# Patient Record
Sex: Male | Born: 2000 | Race: Black or African American | Hispanic: No | Marital: Single | Smoking: Never smoker
Health system: Southern US, Community
[De-identification: ages and names within clinical notes are randomized; demographics above are authoritative.]

## PROBLEM LIST (undated history)

## (undated) DIAGNOSIS — N4400 Torsion of testis, unspecified: Secondary | ICD-10-CM

---
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
## 2020-05-09 ENCOUNTER — Emergency Department (HOSPITAL_COMMUNITY): Payer: Medicaid Other

## 2020-05-09 ENCOUNTER — Observation Stay (HOSPITAL_COMMUNITY)
Admission: EM | Admit: 2020-05-09 | Discharge: 2020-05-10 | Disposition: A | Discharge status: Home / Self Care | Payer: Medicaid Other | Attending: Emergency Medicine | Admitting: Emergency Medicine

## 2020-05-09 ENCOUNTER — Encounter (HOSPITAL_COMMUNITY): Payer: Self-pay

## 2020-05-09 ENCOUNTER — Other Ambulatory Visit: Payer: Self-pay

## 2020-05-09 DIAGNOSIS — N4400 Torsion of testis, unspecified: Principal | ICD-10-CM

## 2020-05-09 DIAGNOSIS — Z20822 Contact with and (suspected) exposure to covid-19: Secondary | ICD-10-CM | POA: Diagnosis not present

## 2020-05-09 LAB — CBC WITH DIFFERENTIAL/PLATELET
Abs Immature Granulocytes: 0.04 10*3/uL (ref 0.00–0.07)
Basophils Absolute: 0 10*3/uL (ref 0.0–0.1)
Basophils Relative: 0 %
Eosinophils Absolute: 0 10*3/uL (ref 0.0–0.5)
Eosinophils Relative: 0 %
HCT: 44.6 % (ref 39.0–52.0)
Hemoglobin: 14.7 g/dL (ref 13.0–17.0)
Immature Granulocytes: 0 %
Lymphocytes Relative: 20 %
Lymphs Abs: 2.8 10*3/uL (ref 0.7–4.0)
MCH: 29.5 pg (ref 26.0–34.0)
MCHC: 33 g/dL (ref 30.0–36.0)
MCV: 89.6 fL (ref 80.0–100.0)
Monocytes Absolute: 0.9 10*3/uL (ref 0.1–1.0)
Monocytes Relative: 6 %
Neutro Abs: 9.8 10*3/uL — ABNORMAL HIGH (ref 1.7–7.7)
Neutrophils Relative %: 74 %
Platelets: 244 10*3/uL (ref 150–400)
RBC: 4.98 MIL/uL (ref 4.22–5.81)
RDW: 12.5 % (ref 11.5–15.5)
WBC: 13.5 10*3/uL — ABNORMAL HIGH (ref 4.0–10.5)
nRBC: 0 % (ref 0.0–0.2)

## 2020-05-09 LAB — BASIC METABOLIC PANEL
Anion gap: 13 (ref 5–15)
BUN: 9 mg/dL (ref 6–20)
CO2: 23 mmol/L (ref 22–32)
Calcium: 9.9 mg/dL (ref 8.9–10.3)
Chloride: 103 mmol/L (ref 98–111)
Creatinine, Ser: 1.03 mg/dL (ref 0.61–1.24)
GFR calc Af Amer: >60 mL/min (ref >60)
GFR calc non Af Amer: >60 mL/min (ref >60)
Glucose, Bld: 140 mg/dL — ABNORMAL HIGH (ref 70–99)
Potassium: 3.9 mmol/L (ref 3.5–5.1)
Sodium: 139 mmol/L (ref 135–145)

## 2020-05-09 MED ORDER — HYDROMORPHONE HCL 1 MG/ML IJ SOLN
1.0000 mg | Freq: Once | INTRAMUSCULAR | Status: AC
  Administered 2020-05-09: 1 mg via INTRAVENOUS
  Filled 2020-05-09: qty 1

## 2020-05-09 MED ORDER — CEFAZOLIN (ANCEF) 1 G IV SOLR: 1.0000 g | INTRAVENOUS | Status: DC

## 2020-05-09 MED ORDER — CEFAZOLIN SODIUM-DEXTROSE 2-4 GM/100ML-% IV SOLN
2.0000 g | INTRAVENOUS | Status: AC
  Administered 2020-05-10: 2 g via INTRAVENOUS
  Filled 2020-05-09: qty 100

## 2020-05-09 MED ORDER — ONDANSETRON HCL 4 MG/2ML IJ SOLN
4.0000 mg | Freq: Once | INTRAMUSCULAR | Status: AC
  Administered 2020-05-09: 4 mg via INTRAVENOUS
  Filled 2020-05-09: qty 2

## 2020-05-09 NOTE — ED Triage Notes (Signed)
Pt comes in [facility] for left lower abdominal pain that goes into the groin. Denies urinary or bowel symptoms. Last sexual inter course possible yesterday. Sudden pain after 8pm

## 2020-05-09 NOTE — ED Provider Notes (Signed)
[facility] [place] EMERGENCY DEPARTMENT Provider Note   CSN: [id number] Arrival date & time: 05/09/20  2123     History Chief Complaint  Patient presents with  . Groin Pain    Adam Wilkinson is a 19 y.o. male.  HPI Patient developed acute left groin/testicle pain at 8 PM tonight.  States severe.  States he has nausea and has vomited.  Pain may not be bulging.  Has not had symptoms like this before.  No dysuria.  No penile discharge.  No fevers.  No trauma.    History reviewed. No pertinent past medical history.  There are no problems to display for this patient.   History reviewed. No pertinent surgical history.     History reviewed. No pertinent family history.  Social History   Tobacco Use  . Smoking status: Never Smoker  . Smokeless tobacco: Never Used  Substance Use Topics  . Alcohol use: No  . Drug use: Yes    Types: Marijuana    Home Medications Prior to Admission medications   Medication Sig Start Date End Date Taking? Authorizing Provider  docusate sodium (COLACE) 100 MG capsule Take 100 mg by mouth 2 (two) times daily.    [provider]  ibuprofen (ADVIL,MOTRIN) 600 MG tablet Take 1 tablet (600 mg total) by mouth every 6 (six) hours as needed. 01/29/17   Burgess Amor, PA-C  polyethylene glycol powder (GLYCOLAX/MIRALAX) powder Take 1 Container by mouth daily. 01/01/14   Ward, Layla Maw, DO    Allergies    Cinnamon  Review of Systems   Review of Systems  Constitutional: Negative for appetite change.  Respiratory: Negative for shortness of breath.   Cardiovascular: Negative for chest pain.  Gastrointestinal: Positive for abdominal pain, nausea and vomiting.  Genitourinary: Positive for testicular pain.  Musculoskeletal: Negative for back pain.  Skin: Negative for rash.  Neurological: Negative for weakness.  Psychiatric/Behavioral: Negative for confusion.    Physical Exam Updated Vital Signs BP 124/66   Pulse (!) 54   Temp 98 F (36.7 C)    Resp 18   Ht 5\' 10"  (1.778 m)   Wt 77.1 kg   SpO2 97%   BMI 24.39 kg/m   Physical Exam Vitals reviewed.  Constitutional:      Comments: Patient appears uncomfortable  HENT:     Head: Normocephalic.  Cardiovascular:     Rate and Rhythm: Normal rate.  Pulmonary:     Breath sounds: No wheezing or rhonchi.  Abdominal:     Tenderness: There is abdominal tenderness.     Comments: Left lower quadrant tenderness without frank hernia palpated.  Genitourinary:    Comments: Left testicular tenderness. Skin:    General: Skin is warm.     Capillary Refill: Capillary refill takes less than 2 seconds.  Neurological:     Mental Status: He is alert and oriented to person, place, and time.     ED Results / Procedures / Treatments   Labs (all labs ordered are listed, but only abnormal results are displayed) Labs Reviewed  BASIC METABOLIC PANEL - Abnormal; Notable for the following components:      Result Value   Glucose, Bld 140 (*)    All other components within normal limits  CBC WITH DIFFERENTIAL/PLATELET - Abnormal; Notable for the following components:   WBC 13.5 (*)    Neutro Abs 9.8 (*)    All other components within normal limits  SARS CORONAVIRUS 2 BY RT PCR (HOSPITAL ORDER, PERFORMED IN [facility])  URINALYSIS, ROUTINE W REFLEX MICROSCOPIC    EKG None  Radiology No results found.  Procedures Procedures (including critical care time)  Medications Ordered in ED Medications  ondansetron (ZOFRAN) injection 4 mg (4 mg Intravenous Given 05/09/20 2148)  HYDROmorphone (DILAUDID) injection 1 mg (1 mg Intravenous Given 05/09/20 2149)  HYDROmorphone (DILAUDID) injection 1 mg (1 mg Intravenous Given 05/09/20 2258)    ED Course  I have reviewed the triage vital signs and the nursing notes.  Pertinent labs & imaging results that were available during my care of the patient were reviewed by me and considered in my medical decision making (see chart for details).      MDM Rules/Calculators/A&P                         Patient seen for acute onset left testicle pain/groin pain.  Initially examined in the hallway since there were no available rooms.  Will attempt to quickly get patient in room and some pain control.  Like will need urgent ultrasound but need better physical exam before I can make this decision.  1005 I was able to examine patient in the room.  Appears to have a high riding testicle on the left with tenderness and decreased mobility of the scrotum.  High likelihood of torsion.  Ultrasound will be called in.  After physical exam I discussed with Dr. Cardell Peach from urology.  The patient's information and actually came to patient's bedside before completion of the ultrasound.  Ultrasound does show torsion.  Will be taken to operating room  CRITICAL CARE Performed by: Benjiman Core Total critical care time: 30 minutes Critical care time was exclusive of separately billable procedures and treating other patients. Critical care was necessary to treat or prevent imminent or life-threatening deterioration. Critical care was time spent personally by me on the following activities: development of treatment plan with patient and/or surrogate as well as nursing, discussions with consultants, evaluation of patient's response to treatment, examination of patient, obtaining history from patient or surrogate, ordering and performing treatments and interventions, ordering and review of laboratory studies, ordering and review of radiographic studies, pulse oximetry and re-evaluation of patient's condition.  Final Clinical Impression(s) / ED Diagnoses Final diagnoses:  Testicular torsion    Rx / DC Orders ED Discharge Orders    None       Benjiman Core, MD 05/09/20 2344

## 2020-05-09 NOTE — H&P (Signed)
H&P  Chief Complaint: Left testicular pain  History of Present Illness:  Adam Wilkinson is a 19 year old male who presents with acute onset of left testicular pain.  He states that the pain began around 8:30 PM.  This is associated with nausea and emesis and left lower quadrant abdominal discomfort and groin discomfort.  He states that his left testicle feels firm.  He has been voiding without difficulty.  He denies dysuria or hematuria.  He denies fevers or chills.  He has never seen a urologist before.  He has no prior urologic history.  He denies a history of urolithiasis, urinary tract infection or trauma to the GU system.  He has no family history of testicular torsion.  He has no significant medical problems.  No prior surgical history.  He denies taking anticoagulation.  History reviewed. No pertinent past medical history.  History reviewed. No pertinent surgical history.  Home Medications:  No current facility-administered medications on file prior to encounter.   Current Outpatient Medications on File Prior to Encounter  Medication Sig Dispense Refill  . docusate sodium (COLACE) 100 MG capsule Take 100 mg by mouth 2 (two) times daily.    Marland Kitchen ibuprofen (ADVIL,MOTRIN) 600 MG tablet Take 1 tablet (600 mg total) by mouth every 6 (six) hours as needed. 30 tablet 0  . polyethylene glycol powder (GLYCOLAX/MIRALAX) powder Take 1 Container by mouth daily. 255 g 0    Allergies:  Allergies  Allergen Reactions  . Cinnamon Swelling    History reviewed. No pertinent family history.  Social History:  reports that he has never smoked. He has never used smokeless tobacco. He reports current drug use. Drug: Marijuana. He reports that he does not drink alcohol.  ROS: A complete review of systems was performed.  All systems are negative except for pertinent findings as noted.  Physical Exam:  Vital signs in last 24 hours: Temp:  [98 F (36.7 C)] 98 F (36.7 C) (09/09 2128) Pulse Rate:  [66]  66 (09/09 2128) Resp:  [22] 22 (09/09 2128) BP: (124-131)/(66-79) 124/66 (09/09 2300) SpO2:  [100 %] 100 % (09/09 2128) Weight:  [77.1 kg] 77.1 kg (09/09 2128) Constitutional:  Alert and oriented, No acute distress Cardiovascular: Regular rate and rhythm Respiratory: Normal respiratory effort, Lungs clear bilaterally GI: Abdomen is soft, nontender, nondistended, no abdominal masses Genitourinary: No CVAT. Normal male phallus, right testicle is in its normal lie and is nontender.  Left testicle is high riding with a horizontal lie and is firm and quite tender.  Absent cremasteric reflex on the left. Neurologic: Grossly intact, no focal deficits Psychiatric: Normal mood and affect  Laboratory Data:  Recent Labs    05/09/20 2144  WBC 13.5*  HGB 14.7  HCT 44.6  PLT 244    Recent Labs    05/09/20 2144  NA 139  K 3.9  CL 103  GLUCOSE 140*  BUN 9  CALCIUM 9.9  CREATININE 1.03     Results for orders placed or performed during the hospital encounter of 05/09/20 (from the past 24 hour(s))  Basic metabolic panel     Status: Abnormal   Collection Time: 05/09/20  9:44 PM  Result Value Ref Range   Sodium 139 135 - 145 mmol/L   Potassium 3.9 3.5 - 5.1 mmol/L   Chloride 103 98 - 111 mmol/L   CO2 23 22 - 32 mmol/L   Glucose, Bld 140 (H) 70 - 99 mg/dL   BUN 9 6 - 20 mg/dL  Creatinine, Ser 1.03 0.61 - 1.24 mg/dL   Calcium 9.9 8.9 - 19.7 mg/dL   GFR calc non Af Amer >60 >60 mL/min   GFR calc Af Amer >60 >60 mL/min   Anion gap 13 5 - 15  CBC with Differential     Status: Abnormal   Collection Time: 05/09/20  9:44 PM  Result Value Ref Range   WBC 13.5 (H) 4.0 - 10.5 K/uL   RBC 4.98 4.22 - 5.81 MIL/uL   Hemoglobin 14.7 13.0 - 17.0 g/dL   HCT 58.8 39 - 52 %   MCV 89.6 80.0 - 100.0 fL   MCH 29.5 26.0 - 34.0 pg   MCHC 33.0 30.0 - 36.0 g/dL   RDW 32.5 49.8 - 26.4 %   Platelets 244 150 - 400 K/uL   nRBC 0.0 0.0 - 0.2 %   Neutrophils Relative % 74 %   Neutro Abs 9.8 (H) 1.7 - 7.7  K/uL   Lymphocytes Relative 20 %   Lymphs Abs 2.8 0.7 - 4.0 K/uL   Monocytes Relative 6 %   Monocytes Absolute 0.9 0 - 1 K/uL   Eosinophils Relative 0 %   Eosinophils Absolute 0.0 0 - 0 K/uL   Basophils Relative 0 %   Basophils Absolute 0.0 0 - 0 K/uL   Immature Granulocytes 0 %   Abs Immature Granulocytes 0.04 0.00 - 0.07 K/uL   No results found for this or any previous visit (from the past 240 hour(s)).  Renal Function: Recent Labs    05/09/20 2144  CREATININE 1.03   Estimated Creatinine Clearance: 118.1 mL/min (by C-G formula based on SCr of 1.03 mg/dL).  Radiologic Imaging: Scrotal ultrasound 05/09/2020: CLINICAL DATA: Testicular pain  EXAM: SCROTAL ULTRASOUND  DOPPLER ULTRASOUND OF THE TESTICLES  TECHNIQUE: Complete ultrasound examination of the testicles, epididymis, and other scrotal structures was performed. Color and spectral Doppler ultrasound were also utilized to evaluate blood flow to the testicles.  COMPARISON: None.  FINDINGS: Right testicle  Measurements: 4.3 x 2.9 x 2.9 cm. No mass or microlithiasis visualized.  Left testicle  Measurements: 4.1 x 2.7 x 3.3 cm. No mass or microlithiasis visualized.  Right epididymis: Normal in size and appearance.  Left epididymis: Normal in size and appearance.  Hydrocele: Mild left hydrocele  Varicocele: None visualized.  Pulsed Doppler interrogation of both testes demonstrates normal arterial and venous blood flow in the right testicle. No blood flow can be documented/visualized on the left.  IMPRESSION: No visible blood flow within the left testicle compatible with testicular torsion.  Critical Value/emergent results were called by telephone at the time of interpretation on 05/09/2020 at 11:41 pm to provider Benjiman Core , who verbally acknowledged these results.   Electronically Signed By: Charlett Nose M.D. On: 05/09/2020 23:44  Impression/Assessment:  1. Left testicular torsion  Plan:   -To OR for left testicular detorsion, bilateral testicular fixation, possible left orchiectomy -I discussed with the patient that we will likely be able to salvage his testicle if we proceed urgently however there is a chance that I may have to remove his left testicle if it is found to be necrotic.  I also discussed the risk of left testicular atrophy even after testicular fixation.  I discussed the risk of postoperative infection or hematoma requiring additional intervention. -Consent obtained -Discussed with the OR team who are on the way to proceed with this urgent case  Jannifer Hick 05/09/2020, 11:38 PM  Matt R. Roderic Lammert MD [facility]  Pager:  [phone]     

## 2020-05-10 ENCOUNTER — Emergency Department (HOSPITAL_COMMUNITY): Payer: Medicaid Other | Admitting: Anesthesiology

## 2020-05-10 ENCOUNTER — Encounter (HOSPITAL_COMMUNITY): Payer: Self-pay | Admitting: Anesthesiology

## 2020-05-10 ENCOUNTER — Encounter (HOSPITAL_COMMUNITY)
Admission: EM | Disposition: A | Discharge status: Home / Self Care | Payer: Self-pay | Source: Home / Self Care | Attending: Emergency Medicine

## 2020-05-10 DIAGNOSIS — N4400 Torsion of testis, unspecified: Secondary | ICD-10-CM

## 2020-05-10 DIAGNOSIS — Z20822 Contact with and (suspected) exposure to covid-19: Secondary | ICD-10-CM | POA: Diagnosis not present

## 2020-05-10 HISTORY — PX: SCROTAL EXPLORATION: SHX2386

## 2020-05-10 LAB — SARS CORONAVIRUS 2 BY RT PCR (HOSPITAL ORDER, PERFORMED IN ~~LOC~~ HOSPITAL LAB): SARS Coronavirus 2: NEGATIVE

## 2020-05-10 SURGERY — EXPLORATION, SCROTUM
Anesthesia: General | Laterality: Left

## 2020-05-10 MED ORDER — MIDAZOLAM HCL 2 MG/2ML IJ SOLN
INTRAMUSCULAR | Status: AC
  Filled 2020-05-10: qty 2

## 2020-05-10 MED ORDER — PROPOFOL 10 MG/ML IV BOLUS
INTRAVENOUS | Status: AC
  Filled 2020-05-10: qty 20

## 2020-05-10 MED ORDER — MIDAZOLAM HCL 2 MG/2ML IJ SOLN
INTRAMUSCULAR | Status: DC | PRN
  Administered 2020-05-10: 2 mg via INTRAVENOUS

## 2020-05-10 MED ORDER — ONDANSETRON HCL 4 MG/2ML IJ SOLN
INTRAMUSCULAR | Status: DC | PRN
  Administered 2020-05-10: 4 mg via INTRAVENOUS

## 2020-05-10 MED ORDER — EPHEDRINE SULFATE 50 MG/ML IJ SOLN
INTRAMUSCULAR | Status: DC | PRN
  Administered 2020-05-10: 10 mg via INTRAVENOUS

## 2020-05-10 MED ORDER — DOCUSATE SODIUM 100 MG PO CAPS: 100.0000 mg | ORAL_CAPSULE | Freq: Every day | ORAL | 2 refills | Status: AC | PRN

## 2020-05-10 MED ORDER — BUPIVACAINE HCL (PF) 0.25 % IJ SOLN
INTRAMUSCULAR | Status: DC | PRN
  Administered 2020-05-10: 5 mL

## 2020-05-10 MED ORDER — HYDROMORPHONE HCL 1 MG/ML IJ SOLN: 0.2500 mg | INTRAMUSCULAR | Status: DC | PRN

## 2020-05-10 MED ORDER — BUPIVACAINE HCL (PF) 0.25 % IJ SOLN
INTRAMUSCULAR | Status: AC
  Filled 2020-05-10: qty 30

## 2020-05-10 MED ORDER — OXYCODONE-ACETAMINOPHEN 5-325 MG PO TABS: 1.0000 | ORAL_TABLET | ORAL | 0 refills | Status: AC | PRN

## 2020-05-10 MED ORDER — FENTANYL CITRATE (PF) 100 MCG/2ML IJ SOLN
INTRAMUSCULAR | Status: AC
  Filled 2020-05-10: qty 2

## 2020-05-10 MED ORDER — OXYCODONE-ACETAMINOPHEN 5-325 MG PO TABS
ORAL_TABLET | ORAL | Status: AC
  Filled 2020-05-10: qty 1

## 2020-05-10 MED ORDER — CHLORHEXIDINE GLUCONATE 0.12 % MT SOLN: 15.0000 mL | Freq: Once | OROMUCOSAL | Status: DC

## 2020-05-10 MED ORDER — ORAL CARE MOUTH RINSE: 15.0000 mL | Freq: Once | OROMUCOSAL | Status: DC

## 2020-05-10 MED ORDER — LACTATED RINGERS IV SOLN: INTRAVENOUS | Status: DC | PRN

## 2020-05-10 MED ORDER — PROPOFOL 10 MG/ML IV BOLUS
INTRAVENOUS | Status: DC | PRN
  Administered 2020-05-10: 50 mg via INTRAVENOUS
  Administered 2020-05-10: 200 mg via INTRAVENOUS

## 2020-05-10 MED ORDER — ONDANSETRON HCL 4 MG/2ML IJ SOLN: 4.0000 mg | Freq: Once | INTRAMUSCULAR | Status: DC | PRN

## 2020-05-10 MED ORDER — FENTANYL CITRATE (PF) 100 MCG/2ML IJ SOLN
INTRAMUSCULAR | Status: DC | PRN
Start: 2020-05-10 — End: 2020-05-10
  Administered 2020-05-10: 100 ug via INTRAVENOUS
  Administered 2020-05-10 (×2): 50 ug via INTRAVENOUS

## 2020-05-10 MED ORDER — 0.9 % SODIUM CHLORIDE (POUR BTL) OPTIME
TOPICAL | Status: DC | PRN
  Administered 2020-05-10: 1000 mL

## 2020-05-10 MED ORDER — LACTATED RINGERS IV SOLN: INTRAVENOUS | Status: DC

## 2020-05-10 SURGICAL SUPPLY — 30 items
BNDG GAUZE ELAST 4 BULKY (GAUZE/BANDAGES/DRESSINGS) ×4 IMPLANT
CLOTH BEACON ORANGE TIMEOUT ST (SAFETY) ×4 IMPLANT
COVER LIGHT HANDLE STERIS (MISCELLANEOUS) ×8 IMPLANT
COVER WAND RF STERILE (DRAPES) ×4 IMPLANT
DECANTER SPIKE VIAL GLASS SM (MISCELLANEOUS) ×4 IMPLANT
DERMABOND ADVANCED (GAUZE/BANDAGES/DRESSINGS) ×2
DERMABOND ADVANCED .7 DNX12 (GAUZE/BANDAGES/DRESSINGS) ×2 IMPLANT
GAUZE SPONGE 4X4 12PLY STRL (GAUZE/BANDAGES/DRESSINGS) ×4 IMPLANT
GLOVE BIOGEL PI IND STRL 7.0 (GLOVE) ×4 IMPLANT
GLOVE BIOGEL PI IND STRL 7.5 (GLOVE) ×2 IMPLANT
GLOVE BIOGEL PI INDICATOR 7.0 (GLOVE) ×4
GLOVE BIOGEL PI INDICATOR 7.5 (GLOVE) ×2
GLOVE ECLIPSE 6.5 STRL STRAW (GLOVE) ×4 IMPLANT
GOWN STRL REUS W/TWL LRG LVL3 (GOWN DISPOSABLE) ×8 IMPLANT
INST SET MINOR GENERAL (KITS) ×4 IMPLANT
KIT TURNOVER KIT A (KITS) ×4 IMPLANT
MANIFOLD NEPTUNE II (INSTRUMENTS) ×4 IMPLANT
NEEDLE HYPO 25X1 1.5 SAFETY (NEEDLE) ×4 IMPLANT
PACK MINOR (CUSTOM PROCEDURE TRAY) ×4 IMPLANT
PENCIL SMOKE EVACUATOR (MISCELLANEOUS) ×4 IMPLANT
SOL PREP POV-IOD 4OZ 10% (MISCELLANEOUS) ×4 IMPLANT
SOL PREP PROV IODINE SCRUB 4OZ (MISCELLANEOUS) ×4 IMPLANT
SUPPORT SCROTAL XLRG (MISCELLANEOUS) ×1
SUPPORT SCROTAL XLRG NO STRP (MISCELLANEOUS) ×3 IMPLANT
SUT MNCRL AB 4-0 PS2 18 (SUTURE) ×4 IMPLANT
SUT PROLENE 5 0 C 1 24 (SUTURE) ×8 IMPLANT
SUT PROLENE NAB BLUE 3-0 30IN (SUTURE) ×8 IMPLANT
SUT VIC AB 4-0 SH 27 (SUTURE) ×2
SUT VIC AB 4-0 SH 27XBRD (SUTURE) ×2 IMPLANT
SYR CONTROL 10ML LL (SYRINGE) ×4 IMPLANT

## 2020-05-10 NOTE — ED Notes (Signed)
Patient taken to OR.

## 2020-05-10 NOTE — Anesthesia Procedure Notes (Signed)
Procedure Name: LMA Insertion Performed by: Windell Norfolk, MD Pre-anesthesia Checklist: Patient identified, Emergency Drugs available, Suction available, Patient being monitored and Timeout performed Patient Re-evaluated:Patient Re-evaluated prior to induction Oxygen Delivery Method: Circle system utilized Preoxygenation: Pre-oxygenation with 100% oxygen Induction Type: IV induction LMA: LMA inserted LMA Size: 4.0 Placement Confirmation: positive ETCO2,  CO2 detector and breath sounds checked- equal and bilateral Tube secured with: Tape Dental Injury: Teeth and Oropharynx as per pre-operative assessment

## 2020-05-10 NOTE — Transfer of Care (Signed)
Immediate Anesthesia Transfer of Care Note  Patient: Adam Wilkinson  Procedure(s) Performed: SCROTUM EXPLORATION, LEFT TESTICULAR FIXATION, RIGHT TESTICULAR FIXATION, POSSIBLE  Patient Location: PACU  Anesthesia Type:General  Level of Consciousness: sedated  Airway & Oxygen Therapy: Patient Spontanous Breathing  Post-op Assessment: Report given to RN and Post -op Vital signs reviewed and stable  Post vital signs: Reviewed and stable  Last Vitals:  Vitals Value Taken Time  BP    Temp    Pulse    Resp    SpO2      Last Pain:  Vitals:   05/10/20 0011  PainSc: 5          Complications: No complications documented.

## 2020-05-10 NOTE — ED Notes (Signed)
Patient contact information collected and patient gave permission to update his mother. Spoke with patient's mother on the phone and gave her an update/ Advised that other information will be provided by OR staff and physicians.  Kari Baars [phone] mother Rene Kocher  [phone]

## 2020-05-10 NOTE — Discharge Summary (Signed)
Date of admission: 05/09/2020  Date of discharge: 05/10/2020  Admission diagnosis: Left testicular torsion  Discharge diagnosis: Left testicular torsion status post detorsion and bilateral fixation in the operating room  Secondary diagnoses: None  History and Physical: For full details, please see admission history and physical. Briefly, Adam Wilkinson is a 19 y.o. year old patient with left testicular torsion.   Hospital Course: Patient was evaluated and found to have left testicular torsion confirmed on scrotal ultrasound.  He was taken to the operating room on 05/10/2020 for a scrotal exploration, left testicular detorsion, bilateral testicular fixation.  He tolerated rated this well without complications.  He recovered well in PACU and was discharged home following recovery.  Laboratory values: Recent Labs    05/09/20 2144  HGB 14.7  HCT 44.6   Recent Labs    05/09/20 2144  CREATININE 1.03    Disposition: Home  Discharge instruction: The patient was instructed to be ambulatory but told to refrain from heavy lifting, strenuous activity, or driving.  Discharge medications:  Allergies as of 05/10/2020      Reactions   Cinnamon Swelling      Medication List    TAKE these medications   docusate sodium 100 MG capsule Commonly known as: COLACE Take 100 mg by mouth 2 (two) times daily. What changed: Another medication with the same name was added. Make sure you understand how and when to take each.   docusate sodium 100 MG capsule Commonly known as: Colace Take 1 capsule (100 mg total) by mouth daily as needed for up to 14 days. What changed: You were already taking a medication with the same name, and this prescription was added. Make sure you understand how and when to take each.   ibuprofen 600 MG tablet Commonly known as: ADVIL Take 1 tablet (600 mg total) by mouth every 6 (six) hours as needed.   oxyCODONE-acetaminophen 5-325 MG tablet Commonly known as:  Percocet Take 1 tablet by mouth every 4 (four) hours as needed for up to 5 days for severe pain.   polyethylene glycol powder 17 GM/SCOOP powder Commonly known as: GLYCOLAX/MIRALAX Take 1 Container by mouth daily.       Followup: Messaged schedulers to arrange for follow-up in 7 to 10 days.  Matt R. Amzie Sillas MD [facility]  Pager: [phone]

## 2020-05-10 NOTE — Op Note (Signed)
Operative Note  Preoperative diagnosis:  1.  Left testicular torsion  Postoperative diagnosis: 1.  Left testicular torsion  Procedure(s): 1.  Scrotal exploration, left testicular detorsion, bilateral testicular fixation  Surgeon: Jettie Pagan, MD  Assistants:  None  Anesthesia:  General  Complications:  None  EBL:  9ml  Specimens: none  Drains/Catheters: none  Intraoperative findings:   1. Left testicle torsed 540 degrees, successfully detorsed and after 10 minutes appeared pink and viable.  Indication:   Adam Wilkinson is a 19 year old male who was found to have acute onset of left testicular torsion which was confirmed with a scrotal ultrasound on 05/09/2020.  His pain began roughly around 8:30 PM.  After a thorough discussion including all relevant risk benefits and alternatives, he presents to the operating room today for left testicular detorsion and bilateral testicular fixation.  Description of procedure: After appropriate informed visit had been obtained, the patient was transferred to the operative suite.  He was positioned supine on the table.  Anesthesia was induced.  Care was taken to pad all pressure points.  Preoperative antibiotics were administered.  Patient was prepped and draped in standard sterile fashion.  A preoperative timeout was obtained identifying the correct patient the surgical site and the surgical procedure.  A midline median raphae 4 cm incision was made.  The left testicle was delivered and appeared to be congested and blue. It was torsed 540 degrees.  The appearance of the left testicle and spermatic cord did improve after the testicle was delivered and placed in a warm saline soaked gauze for several minutes.  Attention was then toward towards the right side.  The right testis was delivered and appeared normal.  Excellent hemostasis was obtained using electrocautery.  The right testis was fixed in place using three-point fixation  (1 lateral and 2 medial fixing the tunica albuginea to the dartos) using 5-0 Prolene sutures.  The left testis was then fixed in place using three-point fixation (1 lateral and 2 medial  fixing the tunica albuginea to the dartos) using 5-0 Prolene.  The dartos was closed with a running 4-0 Vicryl.  The skin was closed using a running 4-0 Monocryl.  Dermabond was then applied.  Gauze fluffs and a scrotal support was then placed.  The patient was awakened from anesthesia and transferred to the recovery room in stable condition.  I was present and scrubbed throughout the entire procedure.  Plan:  Home after PACU recovery. Discharge instructions sent. F/u in office in 2-3 weeks.  Matt R. Summer Parthasarathy MD [facility]  Pager: [phone]

## 2020-05-10 NOTE — Anesthesia Postprocedure Evaluation (Signed)
Anesthesia Post Note  Patient: Adam Wilkinson  Procedure(s) Performed: SCROTUM EXPLORATION, LEFT TESTICULAR FIXATION, RIGHT TESTICULAR FIXATION, POSSIBLE  Patient location during evaluation: PACU Anesthesia Type: General Level of consciousness: sedated Pain management: pain level controlled Vital Signs Assessment: post-procedure vital signs reviewed and stable Respiratory status: spontaneous breathing Cardiovascular status: blood pressure returned to baseline Anesthetic complications: no   No complications documented.   Last Vitals:  Vitals:   05/10/20 0030 05/10/20 0038  BP:  119/60  Pulse: (!) 52 (!) 53  Resp: 11 12  Temp:    SpO2: 100% 94%    Last Pain:  Vitals:   05/10/20 0011  PainSc: 5                  Windell Norfolk

## 2020-05-10 NOTE — ED Notes (Signed)
Patient dressed in gown and under ware and pants removed and placed in a patient belongings bag.

## 2020-05-10 NOTE — Procedures (Deleted)
Operative Note  Preoperative diagnosis:  1.  Left testicular torsion  Postoperative diagnosis: 1.  Left testicular torsion  Procedure(s): 1.  Scrotal exploration, left testicular detorsion, bilateral testicular fixation  Surgeon: Reace Breshears, MD  Assistants:  None  Anesthesia:  General  Complications:  None  EBL:  5ml  Specimens: none  Drains/Catheters: none  Intraoperative findings:   1. Left testicle torsed 540 degrees, successfully detorsed and after 10 minutes appeared pink and viable.  Indication:   Adam Wilkinson is a 19-year-old male who was found to have acute onset of left testicular torsion which was confirmed with a scrotal ultrasound on 05/09/2020.  His pain began roughly around 8:30 PM.  After a thorough discussion including all relevant risk benefits and alternatives, he presents to the operating room today for left testicular detorsion and bilateral testicular fixation.  Description of procedure: After appropriate informed visit had been obtained, the patient was transferred to the operative suite.  He was positioned supine on the table.  Anesthesia was induced.  Care was taken to pad all pressure points.  Preoperative antibiotics were administered.  Patient was prepped and draped in standard sterile fashion.  A preoperative timeout was obtained identifying the correct patient the surgical site and the surgical procedure.  A midline median raphae 4 cm incision was made.  The left testicle was delivered and appeared to be congested and blue. It was torsed 540 degrees.  The appearance of the left testicle and spermatic cord did improve after the testicle was delivered and placed in a warm saline soaked gauze for several minutes.  Attention was then toward towards the right side.  The right testis was delivered and appeared normal.  Excellent hemostasis was obtained using electrocautery.  The right testis was fixed in place using three-point fixation  (1 lateral and 2 medial fixing the tunica albuginea to the dartos) using 5-0 Prolene sutures.  The left testis was then fixed in place using three-point fixation (1 lateral and 2 medial  fixing the tunica albuginea to the dartos) using 5-0 Prolene.  The dartos was closed with a running 4-0 Vicryl.  The skin was closed using a running 4-0 Monocryl.  Dermabond was then applied.  Gauze fluffs and a scrotal support was then placed.  The patient was awakened from anesthesia and transferred to the recovery room in stable condition.  I was present and scrubbed throughout the entire procedure.  Plan:  Home after PACU recovery. Discharge instructions sent. F/u in office in 2-3 weeks.  Adam R. Caide Campi MD [facility]  Pager: [phone] 

## 2020-05-10 NOTE — Discharge Instructions (Signed)
   Activity:  You are encouraged to ambulate frequently (about every hour during waking hours) to help prevent blood clots from forming in your legs or lungs.  However, you should not engage in any heavy lifting (> 10-15 lbs), strenuous activity, or straining.   Diet: You should advance your diet as instructed by your physician.  It will be normal to have some bloating, nausea, and abdominal discomfort intermittently.   Prescriptions:  You will be provided a prescription for pain medication to take as needed.  If your pain is not severe enough to require the prescription pain medication, you may take extra strength Tylenol instead which will have less side effects.  You should also take a prescribed stool softener to avoid straining with bowel movements as the prescription pain medication may constipate you.   Incisions: You may remove your dressing bandages 48 hours after surgery if not removed in the hospital.  You will either have some small staples or special tissue glue at each of the incision sites. Once the bandages are removed (if present), the incisions may stay open to air.  You may start showering (but not soaking or bathing in water) the 2nd day after surgery and the incisions simply need to be patted dry after the shower.  No additional care is needed. No baths for 2 weeks.   What to call us about: You should call the office [phone]) if you develop fever > 101 or develop persistent vomiting. Activity:  You are encouraged to ambulate frequently (about every hour during waking hours) to help prevent blood clots from forming in your legs or lungs.  However, you should not engage in any heavy lifting (> 10-15 lbs), strenuous activity, or straining.  Followup in the office [facility] in [place]) in 2-3 weeks. Call [phone] to confirm appointment.

## 2020-05-10 NOTE — Anesthesia Preprocedure Evaluation (Signed)
Anesthesia Evaluation  Patient identified by MRN, date of birth, ID band Patient awake    Reviewed: Allergy & Precautions, H&P , NPO status , Patient's Chart, lab work & pertinent test results, reviewed documented beta blocker date and time   Airway Mallampati: II  TM Distance: >3 FB Neck ROM: full    Dental no notable dental hx.    Pulmonary neg pulmonary ROS,    Pulmonary exam normal breath sounds clear to auscultation       Cardiovascular Exercise Tolerance: Good negative cardio ROS   Rhythm:regular Rate:Normal     Neuro/Psych negative neurological ROS  negative psych ROS   GI/Hepatic negative GI ROS, Neg liver ROS,   Endo/Other  negative endocrine ROS  Renal/GU negative Renal ROS  negative genitourinary   Musculoskeletal   Abdominal   Peds  Hematology negative hematology ROS (+)   Anesthesia Other Findings   Reproductive/Obstetrics negative OB ROS                             Anesthesia Physical Anesthesia Plan  ASA: I and emergent  Anesthesia Plan: General   Post-op Pain Management:    Induction:   PONV Risk Score and Plan: Ondansetron  Airway Management Planned:   Additional Equipment:   Intra-op Plan:   Post-operative Plan:   Informed Consent: I have reviewed the patients History and Physical, chart, labs and discussed the procedure including the risks, benefits and alternatives for the proposed anesthesia with the patient or authorized representative who has indicated his/her understanding and acceptance.     Dental Advisory Given  Plan Discussed with: CRNA  Anesthesia Plan Comments:         Anesthesia Quick Evaluation

## 2020-05-13 ENCOUNTER — Encounter (HOSPITAL_COMMUNITY): Payer: Self-pay | Admitting: Urology

## 2020-05-21 ENCOUNTER — Ambulatory Visit
Admission: EM | Admit: 2020-05-21 | Discharge: 2020-05-21 | Disposition: A | Discharge status: Home / Self Care | Payer: Medicaid Other | Attending: Family Medicine | Admitting: Family Medicine

## 2020-05-21 ENCOUNTER — Other Ambulatory Visit: Payer: Self-pay

## 2020-05-21 ENCOUNTER — Encounter: Payer: Self-pay | Admitting: Emergency Medicine

## 2020-05-21 DIAGNOSIS — Z113 Encounter for screening for infections with a predominantly sexual mode of transmission: Secondary | ICD-10-CM | POA: Diagnosis not present

## 2020-05-21 NOTE — Discharge Instructions (Signed)
Your swab tests are pending.  If your test results are positive, we will call you.  You may need additional treatment and your partner(s) may also need treatment.      Do not have sex until treatment is completed her tests are back and negative

## 2020-05-21 NOTE — ED Provider Notes (Signed)
[facility]   [id number] 05/21/20 Arrival Time: 1740   CC: CONCERN FOR STD  SUBJECTIVE:  Adam Wilkinson is a 19 y.o. male who presents requesting STI screening.  Currently asymptomatic. Partner asymptomatic. Sexually active. Denies similar symptoms in the past.    Denies fever, chills, nausea, vomiting, abdominal or pelvic pain, penile rashes or lesions, testicular swelling or pain.      ROS: As per HPI.  All other pertinent ROS negative.     History reviewed. No pertinent past medical history. Past Surgical History:  Procedure Laterality Date  . SCROTAL EXPLORATION  05/10/2020   Procedure: SCROTAL  EXPLORATION, LEFT TESTICULAR DETORSION, LEFT TESTICULAR FIXATION, RIGHT TESTICULAR FIXATION;  Surgeon: Jannifer Hick, MD;  Location: [facility];  Service: Urology;;   Allergies  Allergen Reactions  . Cinnamon Swelling   No current facility-administered medications on file prior to encounter.   Current Outpatient Medications on File Prior to Encounter  Medication Sig Dispense Refill  . docusate sodium (COLACE) 100 MG capsule Take 100 mg by mouth 2 (two) times daily.    Marland Kitchen docusate sodium (COLACE) 100 MG capsule Take 1 capsule (100 mg total) by mouth daily as needed for up to 14 days. 30 capsule 2  . ibuprofen (ADVIL,MOTRIN) 600 MG tablet Take 1 tablet (600 mg total) by mouth every 6 (six) hours as needed. 30 tablet 0  . polyethylene glycol powder (GLYCOLAX/MIRALAX) powder Take 1 Container by mouth daily. 255 g 0   Social History   Socioeconomic History  . Marital status: Single    Spouse name: Not on file  . Number of children: Not on file  . Years of education: Not on file  . Highest education level: Not on file  Occupational History  . Not on file  Tobacco Use  . Smoking status: Never Smoker  . Smokeless tobacco: Never Used  Substance and Sexual Activity  . Alcohol use: No  . Drug use: Yes    Types: Marijuana  . Sexual activity: Yes  Other Topics Concern  .  Not on file  Social History Narrative  . Not on file   Social Determinants of Health   Financial Resource Strain:   . Difficulty of Paying Living Expenses: Not on file  Food Insecurity:   . Worried About Programme researcher, broadcasting/film/video in the Last Year: Not on file  . Ran Out of Food in the Last Year: Not on file  Transportation Needs:   . Lack of Transportation (Medical): Not on file  . Lack of Transportation (Non-Medical): Not on file  Physical Activity:   . Days of Exercise per Week: Not on file  . Minutes of Exercise per Session: Not on file  Stress:   . Feeling of Stress : Not on file  Social Connections:   . Frequency of Communication with Friends and Family: Not on file  . Frequency of Social Gatherings with Friends and Family: Not on file  . Attends Religious Services: Not on file  . Active Member of Clubs or Organizations: Not on file  . Attends Banker Meetings: Not on file  . Marital Status: Not on file  Intimate Partner Violence:   . Fear of Current or Ex-Partner: Not on file  . Emotionally Abused: Not on file  . Physically Abused: Not on file  . Sexually Abused: Not on file   No family history on file.  OBJECTIVE:  Vitals:   05/21/20 1749 05/21/20 1750  BP: 125/89  Pulse: 73   Resp: 18   Temp: 98 F (36.7 C)   TempSrc: Oral   SpO2: 97%   Weight:  170 lb (77.1 kg)  Height:  5\' 10"  (1.778 m)     General appearance: alert, NAD, appears stated age Head: NCAT Throat: lips, mucosa, and tongue normal; teeth and gums normal Lungs: CTA bilaterally without adventitious breath sounds Heart: regular rate and rhythm.  Radial pulses 2+ symmetrical bilaterally Back: no CVA tenderness Abdomen: soft, non-tender; bowel sounds normal; no masses or organomegaly; no guarding or rebound tenderness GU: deferred Skin: warm and dry Psychological:  Alert and cooperative. Normal mood and affect.  LABS:  Results for orders placed or performed during the hospital  encounter of 05/09/20  SARS Coronavirus 2 by RT PCR (hospital order, performed in [facility] hospital lab) Nasopharyngeal Nasopharyngeal Swab   Specimen: Nasopharyngeal Swab  Result Value Ref Range   SARS Coronavirus 2 NEGATIVE NEGATIVE  Basic metabolic panel  Result Value Ref Range   Sodium 139 135 - 145 mmol/L   Potassium 3.9 3.5 - 5.1 mmol/L   Chloride 103 98 - 111 mmol/L   CO2 23 22 - 32 mmol/L   Glucose, Bld 140 (H) 70 - 99 mg/dL   BUN 9 6 - 20 mg/dL   Creatinine, Ser [facility] 0.61 - 1.24 mg/dL   Calcium 9.9 8.9 - 7.84 mg/dL   GFR calc non Af Amer >60 >60 mL/min   GFR calc Af Amer >60 >60 mL/min   Anion gap 13 5 - 15  CBC with Differential  Result Value Ref Range   WBC 13.5 (H) 4.0 - 10.5 K/uL   RBC 4.98 4.22 - 5.81 MIL/uL   Hemoglobin 14.7 13.0 - 17.0 g/dL   HCT 69.6 39 - 52 %   MCV 89.6 80.0 - 100.0 fL   MCH 29.5 26.0 - 34.0 pg   MCHC 33.0 30.0 - 36.0 g/dL   RDW 29.5 28.4 - 13.2 %   Platelets 244 150 - 400 K/uL   nRBC 0.0 0.0 - 0.2 %   Neutrophils Relative % 74 %   Neutro Abs 9.8 (H) 1.7 - 7.7 K/uL   Lymphocytes Relative 20 %   Lymphs Abs 2.8 0.7 - 4.0 K/uL   Monocytes Relative 6 %   Monocytes Absolute 0.9 0 - 1 K/uL   Eosinophils Relative 0 %   Eosinophils Absolute 0.0 0 - 0 K/uL   Basophils Relative 0 %   Basophils Absolute 0.0 0 - 0 K/uL   Immature Granulocytes 0 %   Abs Immature Granulocytes 0.04 0.00 - 0.07 K/uL    Labs Reviewed  CYTOLOGY, (ORAL, ANAL, URETHRAL) ANCILLARY ONLY    ASSESSMENT & PLAN:  1. Screen for STD (sexually transmitted disease)     No orders of the defined types were placed in this encounter.   Pending: Labs Reviewed  CYTOLOGY, (ORAL, ANAL, URETHRAL) ANCILLARY ONLY    Self penile swab obtained We will follow up with you regarding the results of your test If tests are positive, please abstain from sexual activity until you and your partner(s) are treated Follow up with PCP or [facility] if symptoms persists Return here  or go to ER if you have any new or worsening symptoms    Reviewed expectations re: course of current medical issues. Questions answered. Outlined signs and symptoms indicating need for more acute intervention. Patient verbalized understanding. After Visit Summary given.       44.0, NP  05/21/20 1816  

## 2020-05-21 NOTE — ED Triage Notes (Signed)
Would like to be checked for std's, denies any symptoms

## 2020-05-24 ENCOUNTER — Telehealth (HOSPITAL_COMMUNITY): Payer: Self-pay | Admitting: Emergency Medicine

## 2020-05-24 LAB — CYTOLOGY, (ORAL, ANAL, URETHRAL) ANCILLARY ONLY
Chlamydia: POSITIVE — AB
Comment: NEGATIVE
Comment: NEGATIVE
Comment: NORMAL
Neisseria Gonorrhea: NEGATIVE
Trichomonas: NEGATIVE

## 2020-05-24 MED ORDER — AZITHROMYCIN 250 MG PO TABS: 1000.0000 mg | ORAL_TABLET | Freq: Once | ORAL | 0 refills | Status: AC

## 2021-08-27 ENCOUNTER — Other Ambulatory Visit: Payer: Self-pay

## 2021-08-27 ENCOUNTER — Ambulatory Visit
Admission: EM | Admit: 2021-08-27 | Discharge: 2021-08-27 | Disposition: A | Discharge status: Home / Self Care | Payer: Medicaid Other | Attending: Urgent Care | Admitting: Urgent Care

## 2021-08-27 DIAGNOSIS — J209 Acute bronchitis, unspecified: Secondary | ICD-10-CM | POA: Diagnosis not present

## 2021-08-27 DIAGNOSIS — R062 Wheezing: Secondary | ICD-10-CM

## 2021-08-27 DIAGNOSIS — R0989 Other specified symptoms and signs involving the circulatory and respiratory systems: Secondary | ICD-10-CM | POA: Diagnosis not present

## 2021-08-27 DIAGNOSIS — F1290 Cannabis use, unspecified, uncomplicated: Secondary | ICD-10-CM | POA: Diagnosis not present

## 2021-08-27 MED ORDER — PROMETHAZINE-DM 6.25-15 MG/5ML PO SYRP: 5.0000 mL | ORAL_SOLUTION | Freq: Every evening | ORAL | 0 refills | Status: DC | PRN

## 2021-08-27 MED ORDER — BENZONATATE 100 MG PO CAPS: 100.0000 mg | ORAL_CAPSULE | Freq: Three times a day (TID) | ORAL | 0 refills | Status: AC | PRN

## 2021-08-27 MED ORDER — CETIRIZINE HCL 10 MG PO TABS: 10.0000 mg | ORAL_TABLET | Freq: Every day | ORAL | 0 refills | Status: AC

## 2021-08-27 MED ORDER — PREDNISONE 20 MG PO TABS: ORAL_TABLET | ORAL | 0 refills | Status: AC

## 2021-08-27 MED ORDER — ALBUTEROL SULFATE HFA 108 (90 BASE) MCG/ACT IN AERS: 1.0000 | INHALATION_SPRAY | Freq: Four times a day (QID) | RESPIRATORY_TRACT | 0 refills | Status: AC | PRN

## 2021-08-27 NOTE — ED Triage Notes (Signed)
Pt presents with c/o cough  and chest congestion that began on Saturday

## 2021-08-27 NOTE — ED Provider Notes (Signed)
[facility]   MRN: [id number] DOB: 16-Mar-2001  Subjective:   Adam Wilkinson is a 20 y.o. male presenting for 4-day history of acute onset persistent coughing, chest congestion and wheezing.  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  Smokes marijuana multiple times daily.  No history of asthma.  To the best of his knowledge, he is otherwise healthy.  No sick contacts that he knows of.  He is not opposed to testing however.  No current facility-administered medications for this encounter.  Current Outpatient Medications:    docusate sodium (COLACE) 100 MG capsule, Take 100 mg by mouth 2 (two) times daily., Disp: , Rfl:    ibuprofen (ADVIL,MOTRIN) 600 MG tablet, Take 1 tablet (600 mg total) by mouth every 6 (six) hours as needed., Disp: 30 tablet, Rfl: 0   polyethylene glycol powder (GLYCOLAX/MIRALAX) powder, Take 1 Container by mouth daily., Disp: 255 g, Rfl: 0   Allergies  Allergen Reactions   Cinnamon Swelling    History reviewed. No pertinent past medical history.   Past Surgical History:  Procedure Laterality Date   SCROTAL EXPLORATION  05/10/2020   Procedure: SCROTAL  EXPLORATION, LEFT TESTICULAR DETORSION, LEFT TESTICULAR FIXATION, RIGHT TESTICULAR FIXATION;  Surgeon: Jannifer Hick, MD;  Location: [facility];  Service: Urology;;    History reviewed. No pertinent family history.  Social History   Tobacco Use   Smoking status: Never   Smokeless tobacco: Never  Substance Use Topics   Alcohol use: No   Drug use: Yes    Types: Marijuana    ROS   Objective:   Vitals: BP 127/80    Pulse 71    Temp 98.1 F (36.7 C)    Resp 20    SpO2 94%   Physical Exam Constitutional:      General: He is not in acute distress.    Appearance: Normal appearance. He is well-developed. He is not ill-appearing, toxic-appearing or diaphoretic.  HENT:     Head: Normocephalic and atraumatic.     Right Ear: External ear normal.     Left  Ear: External ear normal.     Nose: Nose normal.     Mouth/Throat:     Mouth: Mucous membranes are moist.     Pharynx: Oropharynx is clear.  Eyes:     General: No scleral icterus.    Extraocular Movements: Extraocular movements intact.     Pupils: Pupils are equal, round, and reactive to light.  Cardiovascular:     Rate and Rhythm: Normal rate and regular rhythm.     Heart sounds: Normal heart sounds. No murmur heard.   No friction rub. No gallop.  Pulmonary:     Effort: Pulmonary effort is normal. No respiratory distress.     Breath sounds: No stridor. Wheezing (mid to lower lung fields bilaterally) present. No rhonchi or rales.  Neurological:     Mental Status: He is alert and oriented to person, place, and time.  Psychiatric:        Mood and Affect: Mood normal.        Behavior: Behavior normal.        Thought Content: Thought content normal.    Assessment and Plan :   PDMP not reviewed this encounter.  1. Acute bronchitis, unspecified organism   2. Marijuana smoker   3. Wheezing   4. Chest congestion    Will use an oral prednisone course and albuterol given his lung exam, marijuana abuse.  Otherwise will defer imaging as there are no signs of pneumonia.  Encouraged patient to quit smoking marijuana.  Use supportive care otherwise for a viral respiratory illness, bronchitis.  COVID and flu test pending.  Counseled patient on potential for adverse effects with medications prescribed/recommended today, ER and return-to-clinic precautions discussed, patient verbalized understanding.    Wallis Bamberg, [place] 08/27/21 1818

## 2021-08-28 LAB — COVID-19, FLU A+B NAA
Influenza A, NAA: NOT DETECTED
Influenza B, NAA: NOT DETECTED
SARS-CoV-2, NAA: NOT DETECTED

## 2022-05-15 IMAGING — US US SCROTUM W/ DOPPLER COMPLETE
1 series · 14 of 25 positions shown · non-contrast
Comparison: None.

CLINICAL DATA: Testicular pain

EXAM:
SCROTAL ULTRASOUND
DOPPLER ULTRASOUND OF THE TESTICLES
TECHNIQUE: Complete ultrasound examination of the testicles, epididymis, and
other scrotal structures was performed. Color and spectral Doppler
ultrasound were also utilized to evaluate blood flow to the
testicles.

[Series 1: us scrotum w/doppler · 14 of 71 slices shown]
[im 1/71]
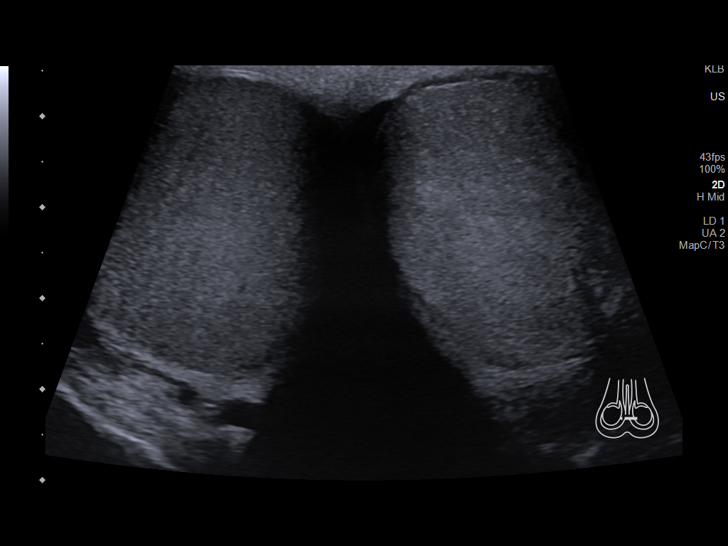
[im 6/71]
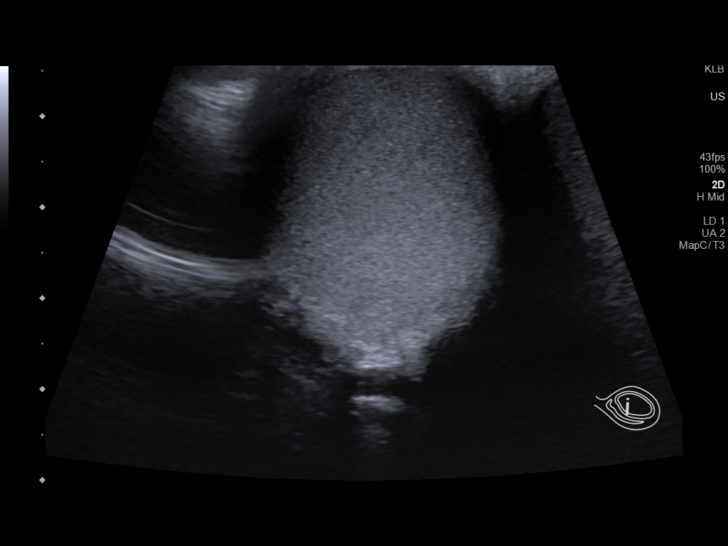
[im 12/71]
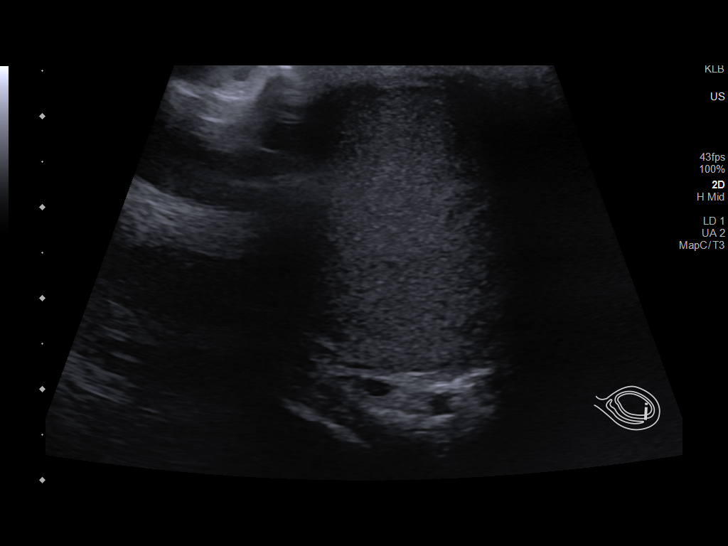
[im 18/71]
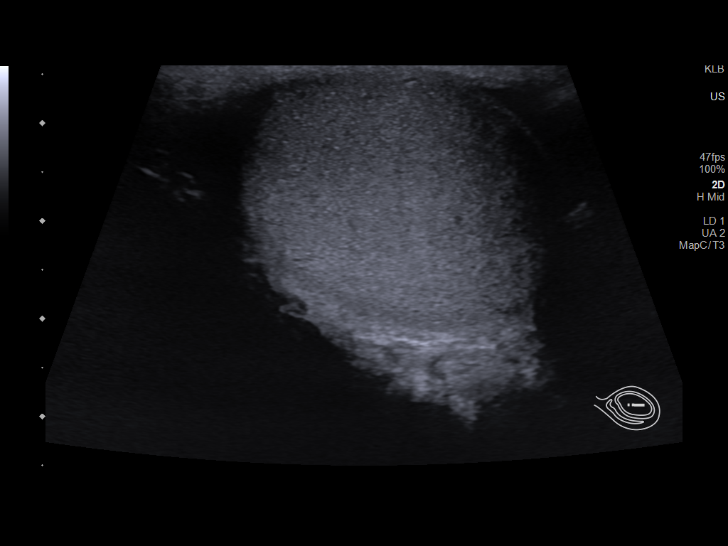
[im 24/71]
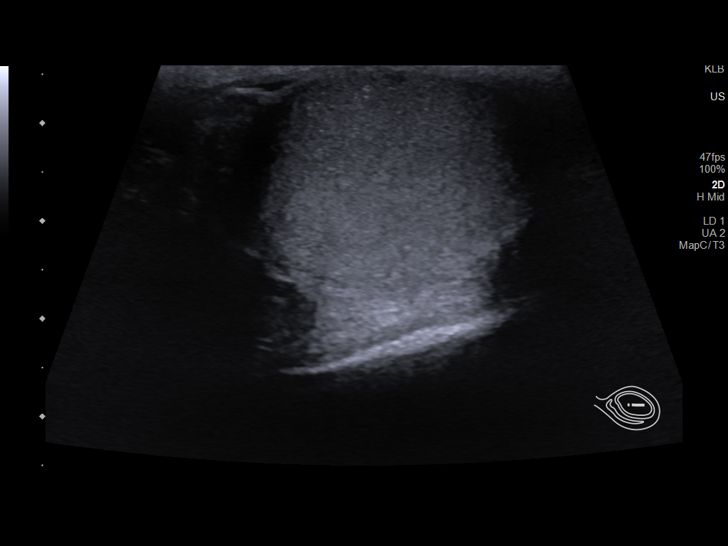
[im 27/71]
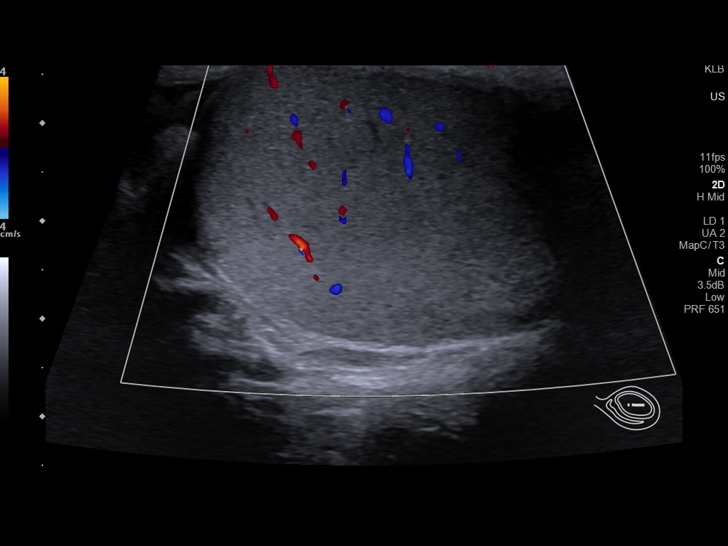
[im 33/71]
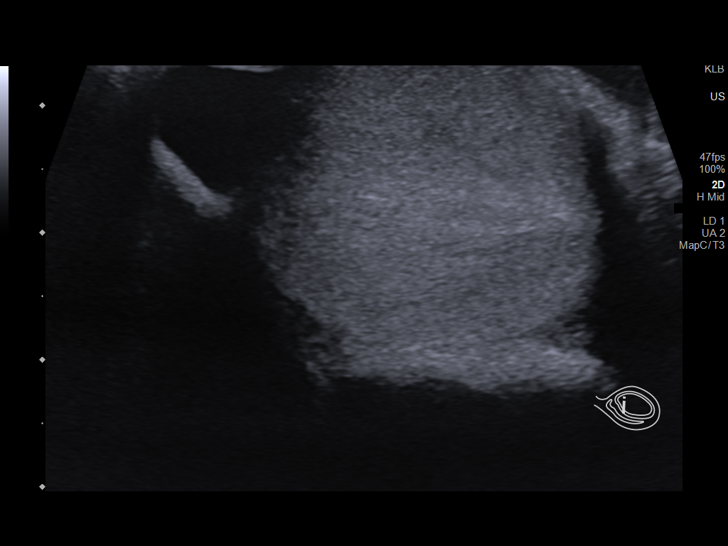
[im 38/71]
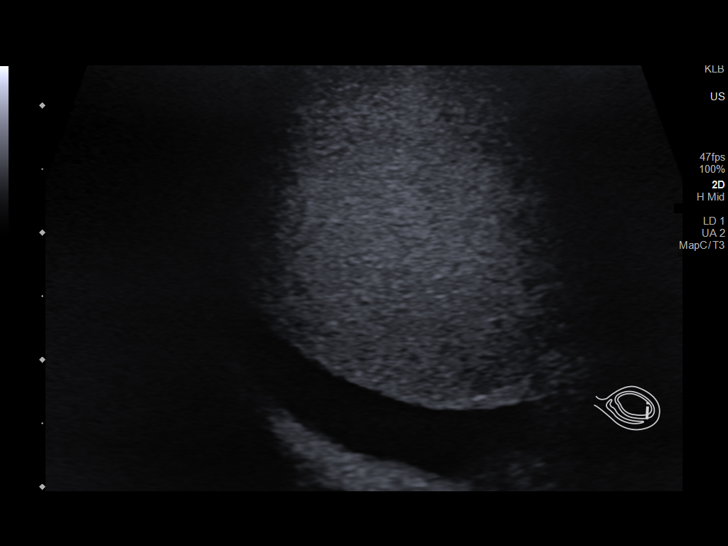
[im 44/71]
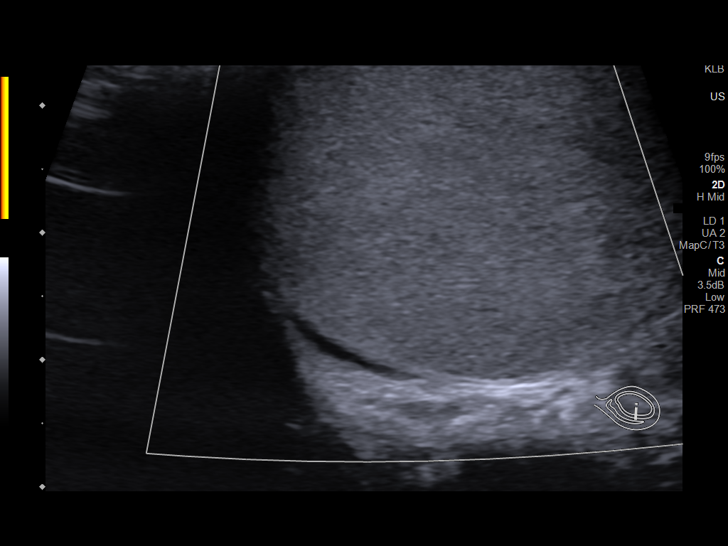
[im 47/71]
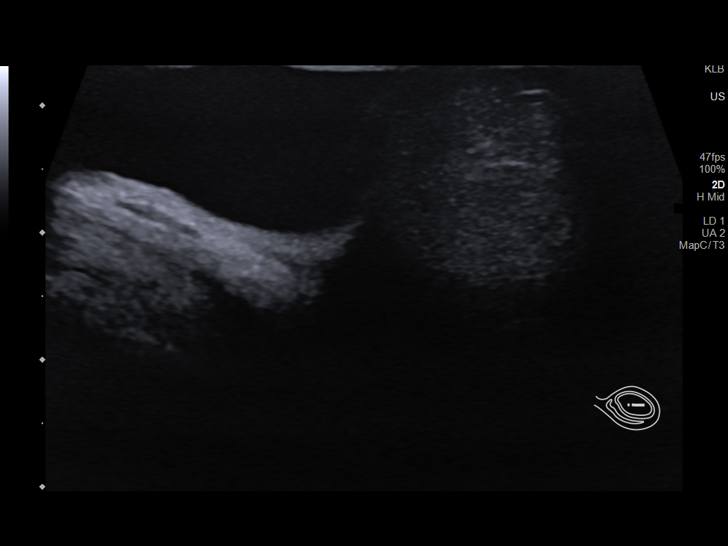
[im 53/71]
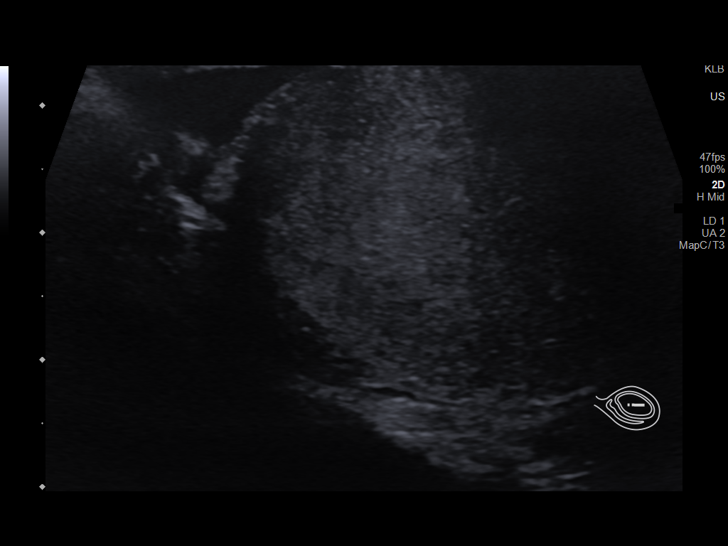
[im 59/71]
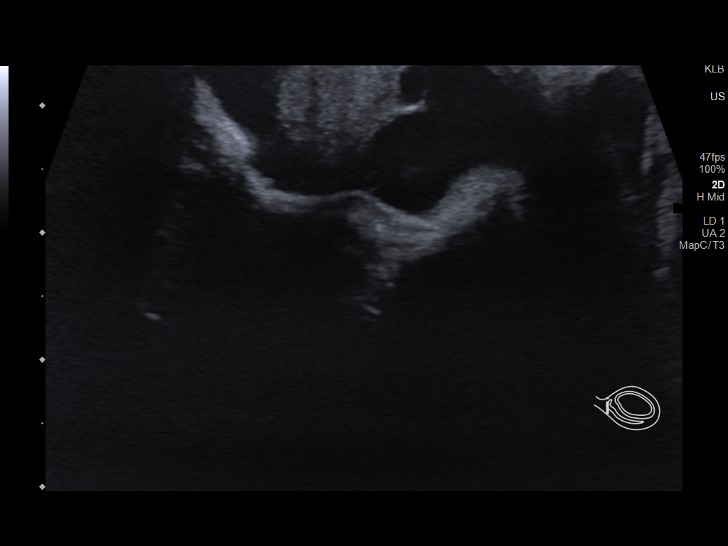
[im 65/71]
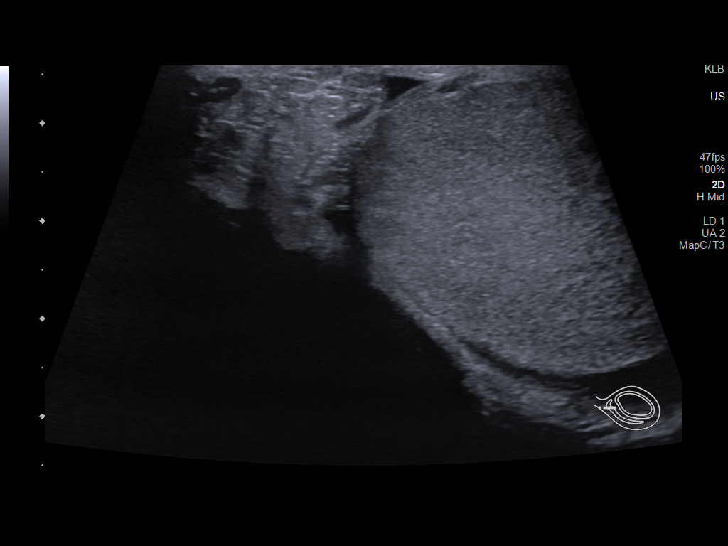
[im 71/71]
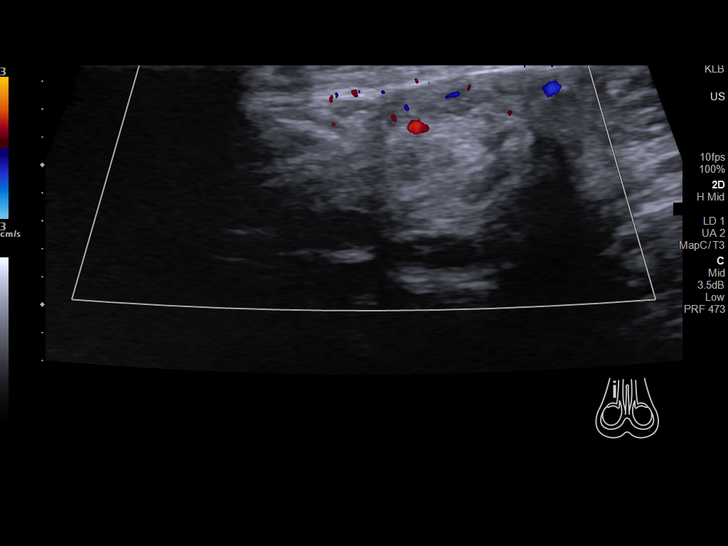

[14 of 25 positions shown; findings below may reference images not displayed]

FINDINGS: Right testicle

Measurements: 4.3 x 2.9 x 2.9 cm. No mass or microlithiasis
visualized.

Left testicle

Measurements: 4.1 x 2.7 x 3.3 cm. No mass or microlithiasis
visualized.

Right epididymis:  Normal in size and appearance.

Left epididymis:  Normal in size and appearance.

Hydrocele:  Mild left hydrocele

Varicocele:  None visualized.

Pulsed Doppler interrogation of both testes demonstrates normal
arterial and venous blood flow in the right testicle. No blood flow
can be documented/visualized on the left.
IMPRESSION: No visible blood flow within the left testicle compatible with
testicular torsion.

Critical Value/emergent results were called by telephone at the time
of interpretation on 05/09/2020 at [DATE] to provider TATOSHI
ABDULHAFED , who verbally acknowledged these results.

## 2023-05-20 ENCOUNTER — Ambulatory Visit
Admission: EM | Admit: 2023-05-20 | Discharge: 2023-05-20 | Disposition: A | Discharge status: Home / Self Care | Payer: Medicaid Other | Attending: Nurse Practitioner | Admitting: Nurse Practitioner

## 2023-05-20 ENCOUNTER — Other Ambulatory Visit: Payer: Self-pay

## 2023-05-20 DIAGNOSIS — B349 Viral infection, unspecified: Secondary | ICD-10-CM | POA: Insufficient documentation

## 2023-05-20 DIAGNOSIS — Z1152 Encounter for screening for COVID-19: Secondary | ICD-10-CM | POA: Insufficient documentation

## 2023-05-20 LAB — POCT RAPID STREP A (OFFICE): Rapid Strep A Screen: NEGATIVE

## 2023-05-20 MED ORDER — PROMETHAZINE-DM 6.25-15 MG/5ML PO SYRP: 5.0000 mL | ORAL_SOLUTION | Freq: Four times a day (QID) | ORAL | 0 refills | Status: AC | PRN

## 2023-05-20 MED ORDER — FLUTICASONE PROPIONATE 50 MCG/ACT NA SUSP: 2.0000 | Freq: Every day | NASAL | 0 refills | Status: AC

## 2023-05-20 NOTE — ED Triage Notes (Signed)
Pt reports he has a headache, fever, and throat pain x 2 days

## 2023-05-20 NOTE — ED Provider Notes (Signed)
[facility]    CSN: [id number] Arrival date & time: 05/20/23  1643      History   Chief Complaint Chief Complaint  Patient presents with   Sore Throat    HPI Cosmo Sawtelle is a 22 y.o. male.   The history is provided by the patient.   Patient presents for complaints of headache, sore throat, nasal congestion, and a mild cough.  Symptoms have been present for the past 2 days.  Patient actually denies fever, chills, ear pain, wheezing, difficulty breathing, abdominal pain, nausea, vomiting, or diarrhea.  Patient reports that he does have a history of seasonal allergies, and symptoms were exacerbated with all the rain that occurred earlier this week.  Patient reports he has stopped smoking marijuana since he was seen here last and diagnosed with bronchitis. History reviewed. No pertinent past medical history.  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 Authorizing Provider  fluticasone (FLONASE) 50 MCG/ACT nasal spray Place 2 sprays into both nostrils daily. 05/20/23  Yes Vihana Kydd-Warren, Sadie Haber, NP  promethazine-dextromethorphan (PROMETHAZINE-DM) 6.25-15 MG/5ML syrup Take 5 mLs by mouth 4 (four) times daily as needed. 05/20/23  Yes Lonnetta Kniskern-Warren, Sadie Haber, NP  albuterol (VENTOLIN HFA) 108 (90 Base) MCG/ACT inhaler Inhale 1-2 puffs into the lungs every 6 (six) hours as needed for wheezing or shortness of breath. 08/27/21   Wallis Bamberg, PA-C  benzonatate (TESSALON) 100 MG capsule Take 1-2 capsules (100-200 mg total) by mouth 3 (three) times daily as  needed for cough. 08/27/21   Wallis Bamberg, PA-C  cetirizine (ZYRTEC ALLERGY) 10 MG tablet Take 1 tablet (10 mg total) by mouth daily. 08/27/21   Wallis Bamberg, PA-C  docusate sodium (COLACE) 100 MG capsule Take 100 mg by mouth 2 (two) times daily.    [provider]  ibuprofen (ADVIL,MOTRIN) 600 MG tablet Take 1 tablet (600 mg total) by mouth every 6 (six) hours as needed. 01/29/17   Burgess Amor, PA-C  polyethylene glycol powder (GLYCOLAX/MIRALAX) powder Take 1 Container by mouth daily. 01/01/14   Ward, Layla Maw, DO  predniSONE (DELTASONE) 20 MG tablet Take 2 tablets daily with breakfast. 08/27/21   Wallis Bamberg, PA-C    Family History History reviewed. No pertinent family history.  Social History Social History   Tobacco Use   Smoking status: Never   Smokeless tobacco: Never  Substance Use Topics   Alcohol use: No   Drug use: Yes    Types: Marijuana     Allergies   Cinnamon   Review of Systems Review of Systems Per HPI     Systolic BP Percentile --      Diastolic BP Percentile --      Pulse Rate 05/20/23 1648 60     Resp 05/20/23 1648 20     Temp 05/20/23 1648 98.3 F (36.8 C)     Temp Source 05/20/23 1648 Oral     SpO2 05/20/23 1648 97 %     Weight --      Height --      Head Circumference --  Peak Flow --      Pain Score 05/20/23 1649 6     Pain Loc --      Pain Education --      Exclude from Growth Chart --    No data found.  Updated Vital Signs BP 123/77 (BP Location: Right Arm)   Pulse 60   Temp 98.3 F (36.8 C) (Oral)   Resp 20   SpO2 97%   Visual Acuity Right Eye Distance:   Left Eye Distance:   Bilateral Distance:    Right Eye Near:   Left Eye Near:    Bilateral Near:     Physical Exam Vitals and nursing note reviewed.  Constitutional:      General: He is not in acute distress.    Appearance: Normal appearance.  HENT:     Head: Normocephalic.     Right Ear: Tympanic membrane, ear canal and external ear normal.     Left Ear:  Tympanic membrane, ear canal and external ear normal.     Nose: Congestion present.     Mouth/Throat:     Mouth: Mucous membranes are moist.     Pharynx: Posterior oropharyngeal erythema present.     Comments: Cobblestoning present to posterior oropharynx Eyes:     Extraocular Movements: Extraocular movements intact.     Pupils: Pupils are equal, round, and reactive to light.  Cardiovascular:     Rate and Rhythm: Normal rate and regular rhythm.     Pulses: Normal pulses.     Heart sounds: Normal heart sounds.  Pulmonary:     Effort: Pulmonary effort is normal. No respiratory distress.     Breath sounds: Normal breath sounds. No stridor. No wheezing, rhonchi or rales.  Abdominal:     General: Bowel sounds are normal.     Palpations: Abdomen is soft.     Tenderness: There is no abdominal tenderness.  Musculoskeletal:     Cervical back: Normal range of motion.  Lymphadenopathy:     Cervical: No cervical adenopathy.  Skin:    General: Skin is warm and dry.  Neurological:     General: No focal deficit present.     Mental Status: He is alert and oriented to person, place, and time.  Psychiatric:        Mood and Affect: Mood normal.        Behavior: Behavior normal.      UC Treatments / Results  Labs (all labs ordered are listed, but only abnormal results are displayed) Labs Reviewed  SARS CORONAVIRUS 2 (TAT 6-24 HRS)  CULTURE, GROUP A STREP [facility])  POCT RAPID STREP A (OFFICE)    EKG   Radiology No results found.  Procedures Procedures (including critical care time)  Medications Ordered in UC Medications - No data to display  Initial Impression / Assessment and Plan / UC Course  I have reviewed the triage vital signs and the nursing notes.  Pertinent labs & imaging results that were available during my care of the patient were reviewed by me and considered in my medical decision making (see chart for details).  The patient is well-appearing, he is in no acute  distress, vital signs are stable.  Rapid strep test was negative.  Throat culture and COVID test are pending.  Patient is able to receive Paxlovid if his COVID test is positive.  Differential diagnoses include viral upper respiratory infection versus allergic rhinitis.  Will provide symptomatic treatment with fluticasone 50 mcg nasal spray, and Promethazine  DM for his cough.  Supportive care recommendations were provided and discussed with the patient to include over-the-counter analgesics, warm salt water gargles, and increasing fluids and allowing for plenty of rest.  Discussed indications of when follow-up will be necessary.  Patient is in agreement with this plan of care and verbalizes understanding.  All questions were answered.  Patient stable for discharge.  Note was provided for work.  Final Clinical Impressions(s) / UC Diagnoses   Final diagnoses:  Viral illness  Encounter for screening for COVID-19     Discharge Instructions      The rapid strep test was negative.  A throat culture and COVID test are pending.  You will be contacted if the pending test results are abnormal.  You will also have access to results via MyChart. Take medication as prescribed. Increase fluids and allow for plenty of rest. May take over-the-counter Tylenol or ibuprofen as needed for pain, fever, or general discomfort. Warm salt water gargles as needed for throat pain or discomfort. Recommend using a humidifier in your bedroom at nighttime during sleep and sleeping elevated on pillows while cough symptoms persist. As discussed, if symptoms do not improve over the next 7 to 10 days, or if they suddenly worsen, you may follow-up in this clinic or with your primary care physician for further evaluation. Follow-up as needed.     ED Prescriptions     Medication Sig Dispense Auth. Provider   fluticasone (FLONASE) 50 MCG/ACT nasal spray Place 2 sprays into both nostrils daily. 16 g Zian Mohamed-Warren, Sadie Haber,  NP   promethazine-dextromethorphan (PROMETHAZINE-DM) 6.25-15 MG/5ML syrup Take 5 mLs by mouth 4 (four) times daily as needed. 118 mL Qusay Villada-Warren, Sadie Haber, NP      PDMP not reviewed this encounter.   Abran Cantor, NP 05/20/23 1733

## 2023-05-20 NOTE — Discharge Instructions (Addendum)
The rapid strep test was negative.  A throat culture and COVID test are pending.  You will be contacted if the pending test results are abnormal.  You will also have access to results via MyChart. Take medication as prescribed. Increase fluids and allow for plenty of rest. May take over-the-counter Tylenol or ibuprofen as needed for pain, fever, or general discomfort. Warm salt water gargles as needed for throat pain or discomfort. Recommend using a humidifier in your bedroom at nighttime during sleep and sleeping elevated on pillows while cough symptoms persist. As discussed, if symptoms do not improve over the next 7 to 10 days, or if they suddenly worsen, you may follow-up in this clinic or with your primary care physician for further evaluation. Follow-up as needed.

## 2023-05-21 LAB — SARS CORONAVIRUS 2 (TAT 6-24 HRS): SARS Coronavirus 2: POSITIVE — AB

## 2023-05-23 LAB — CULTURE, GROUP A STREP (THRC)

## 2024-09-09 ENCOUNTER — Emergency Department (HOSPITAL_COMMUNITY)
Admission: EM | Admit: 2024-09-09 | Discharge: 2024-09-09 | Disposition: A | Discharge status: Home / Self Care | Payer: Self-pay | Attending: Emergency Medicine | Admitting: Emergency Medicine

## 2024-09-09 ENCOUNTER — Encounter (HOSPITAL_COMMUNITY): Payer: Self-pay

## 2024-09-09 ENCOUNTER — Emergency Department (HOSPITAL_COMMUNITY): Payer: Self-pay

## 2024-09-09 ENCOUNTER — Other Ambulatory Visit: Payer: Self-pay

## 2024-09-09 DIAGNOSIS — N50811 Right testicular pain: Secondary | ICD-10-CM | POA: Insufficient documentation

## 2024-09-09 DIAGNOSIS — R1031 Right lower quadrant pain: Secondary | ICD-10-CM | POA: Insufficient documentation

## 2024-09-09 HISTORY — DX: Torsion of testis, unspecified: N44.00

## 2024-09-09 NOTE — ED Triage Notes (Signed)
 Pt with a hx of testicular torsion. Today while he was standing in the kitchen he felt a click in his lower abd and started having pressure in his abd and R testicle. He denies N/V/D.

## 2024-09-09 NOTE — Discharge Instructions (Addendum)
 Thankfully the ultrasound of your scrotum looks normal, your testicle is normal, there is no signs of torsion or twisting, ibuprofen  as needed, see your doctor in follow-up if you have ongoing discomfort as you may need referral to a general surgeon but at this time there is no evidence of a hernia

## 2024-09-09 NOTE — ED Notes (Signed)
 This RN chaperoned US  tech during exam.

## 2024-09-09 NOTE — ED Provider Notes (Signed)
 " [place] [facility] AT [facility] Provider Note   CSN: [id number] Arrival date & time: 09/09/24  2037     Patient presents with: Testicle Pain   Adam Wilkinson is a 24 y.o. male.    Testicle Pain   This patient is a 24 year old male presenting with right testicular discomfort associated with some right lower quadrant abdominal discomfort and groin discomfort which started earlier this evening while he was in the kitchen.  He states that he felt a click sensation and then a fullness in the right groin, there is no significant pain or tenderness or abnormal appearance or swelling but he states that he has had a prior right sided orchiopexy secondary to torsion 3 or 4 years ago and was told that he needed to get it checked out if he ever felt anything abnormal again.  He is not nauseated vomiting or having any urinary symptoms.    Prior to Admission medications  Medication Sig Start Date End Date Taking? Authorizing Provider  albuterol  (VENTOLIN  HFA) 108 (90 Base) MCG/ACT inhaler Inhale 1-2 puffs into the lungs every 6 (six) hours as needed for wheezing or shortness of breath. 08/27/21   Christopher Savannah, PA-C  benzonatate  (TESSALON ) 100 MG capsule Take 1-2 capsules (100-200 mg total) by mouth 3 (three) times daily as needed for cough. 08/27/21   Christopher Savannah, PA-C  cetirizine  (ZYRTEC  ALLERGY) 10 MG tablet Take 1 tablet (10 mg total) by mouth daily. 08/27/21   Christopher Savannah, PA-C  docusate sodium  (COLACE) 100 MG capsule Take 100 mg by mouth 2 (two) times daily.    [provider]  fluticasone  (FLONASE ) 50 MCG/ACT nasal spray Place 2 sprays into both nostrils daily. 05/20/23   Leath-Warren, Etta PARAS, NP  ibuprofen  (ADVIL ,MOTRIN ) 600 MG tablet Take 1 tablet (600 mg total) by mouth every 6 (six) hours as needed. 01/29/17   Idol, Julie, PA-C  polyethylene glycol powder (GLYCOLAX /MIRALAX ) powder Take 1 Container by mouth daily. 01/01/14   Ward, Josette SAILOR, DO  predniSONE   (DELTASONE ) 20 MG tablet Take 2 tablets daily with breakfast. 08/27/21   Christopher Savannah, PA-C  promethazine -dextromethorphan (PROMETHAZINE -DM) 6.25-15 MG/5ML syrup Take 5 mLs by mouth 4 (four) times daily as needed. 05/20/23   Leath-Warren, Etta PARAS, NP    Allergies: Cinnamon    Review of Systems  Genitourinary:  Positive for testicular pain.  All other systems reviewed and are negative.   Updated Vital Signs BP 114/60   Pulse 60   Temp 99.2 F (37.3 C) (Oral)   Resp 18   Ht 1.778 m (5' 10)   Wt 95.3 kg   SpO2 99%   BMI 30.13 kg/m   Physical Exam Vitals and nursing note reviewed.  Constitutional:      General: He is not in acute distress.    Appearance: He is well-developed.  HENT:     Head: Normocephalic and atraumatic.     Mouth/Throat:     Pharynx: No oropharyngeal exudate.  Eyes:     General: No scleral icterus.       Right eye: No discharge.        Left eye: No discharge.     Conjunctiva/sclera: Conjunctivae normal.     Pupils: Pupils are equal, round, and reactive to light.  Neck:     Thyroid: No thyromegaly.     Vascular: No JVD.  Cardiovascular:     Rate and Rhythm: Normal rate and regular rhythm.     Heart sounds: Normal  heart sounds. No murmur heard.    No friction rub. No gallop.  Pulmonary:     Effort: Pulmonary effort is normal. No respiratory distress.     Breath sounds: Normal breath sounds. No wheezing or rales.  Abdominal:     General: Bowel sounds are normal. There is no distension.     Palpations: Abdomen is soft. There is no mass.     Tenderness: There is no abdominal tenderness.  Genitourinary:    Comments: The patient has a totally normal abdominal exam without tenderness, there is a slight mild tenderness in the right inguinal region but no obvious hernia, the scrotum is normal testicles are normal there is no tenderness and there is normal cremasteric reflex, penis is normal Musculoskeletal:        General: No tenderness. Normal range of  motion.     Cervical back: Normal range of motion and neck supple.     Right lower leg: No edema.     Left lower leg: No edema.  Lymphadenopathy:     Cervical: No cervical adenopathy.  Skin:    General: Skin is warm and dry.     Findings: No erythema or rash.  Neurological:     Mental Status: He is alert.     Coordination: Coordination normal.  Psychiatric:        Behavior: Behavior normal.     (all labs ordered are listed, but only abnormal results are displayed) Labs Reviewed - No data to display  EKG: None  Radiology: US  SCROTUM W/DOPPLER Result Date: 09/09/2024 CLINICAL DATA:  Testicular pain, history of torsion EXAM: SCROTAL ULTRASOUND DOPPLER ULTRASOUND OF THE TESTICLES TECHNIQUE: Complete ultrasound examination of the testicles, epididymis, and other scrotal structures was performed. Color and spectral Doppler ultrasound were also utilized to evaluate blood flow to the testicles. COMPARISON:  05/09/2020 FINDINGS: Right testicle Measurements: 3.3 x 4.5 x 2.7 cm. No mass or microlithiasis visualized. Doppler: There is normal vascularity on color doppler examination. Spectral doppler arterial and venous waveforms are normal. Left testicle Measurements:  3.2 x 3.7 x 2.3 cm. No mass or microlithiasis visualized. Doppler: There is normal vascularity on color doppler examination. Spectral doppler arterial and venous waveforms are normal. Right epididymis:  Normal in size and appearance. Left epididymis:  Normal in size and appearance. Hydrocele:  None visualized. Varicocele:  None visualized. IMPRESSION: 1. Unremarkable testicular ultrasound.  No evidence of torsion. Electronically Signed   By: Ozell Daring M.D.   On: 09/09/2024 22:34     Procedures   Medications Ordered in the ED - No data to display                                  Medical Decision Making  No acute findings on exam but history is highly concerning for recurrent problem so we will obtain ultrasound of the  scrotum with flow to make sure there is no signs of torsion, minimal tenderness posterior testicle to suggest possible epididymitis but far more likely to be possibly an early inguinal hernia, nothing that needs to be reduced, nothing incarcerated, nonacute abdomen   Radiology Imaging: I personally viewed the images of the ordered radiographic studies and find no evidence for torsion I agree with the radiologist interpretation as well      Final diagnoses:  Right inguinal pain    ED Discharge Orders     None  Cleotilde Rogue, MD 09/09/24 2243  "
